# Patient Record
Sex: Female | Born: 1990 | Race: White | Hispanic: No | Marital: Married | Smoking: Never smoker
Health system: Southern US, Community
[De-identification: ages and names within clinical notes are randomized; demographics above are authoritative.]

## PROBLEM LIST (undated history)

## (undated) DIAGNOSIS — M549 Dorsalgia, unspecified: Secondary | ICD-10-CM

---

## 2021-03-16 ENCOUNTER — Other Ambulatory Visit: Payer: Self-pay

## 2021-03-16 ENCOUNTER — Ambulatory Visit
Admission: EM | Admit: 2021-03-16 | Discharge: 2021-03-16 | Disposition: A | Discharge status: Home / Self Care | Attending: Internal Medicine | Admitting: Internal Medicine

## 2021-03-16 ENCOUNTER — Ambulatory Visit (INDEPENDENT_AMBULATORY_CARE_PROVIDER_SITE_OTHER)

## 2021-03-16 ENCOUNTER — Encounter: Payer: Self-pay | Admitting: Emergency Medicine

## 2021-03-16 DIAGNOSIS — R0602 Shortness of breath: Secondary | ICD-10-CM | POA: Diagnosis not present

## 2021-03-16 DIAGNOSIS — R059 Cough, unspecified: Secondary | ICD-10-CM | POA: Diagnosis not present

## 2021-03-16 DIAGNOSIS — Z20822 Contact with and (suspected) exposure to covid-19: Secondary | ICD-10-CM | POA: Insufficient documentation

## 2021-03-16 DIAGNOSIS — R062 Wheezing: Secondary | ICD-10-CM

## 2021-03-16 HISTORY — DX: Dorsalgia, unspecified: M54.9

## 2021-03-16 MED ORDER — ALBUTEROL SULFATE HFA 108 (90 BASE) MCG/ACT IN AERS
2.0000 | INHALATION_SPRAY | Freq: Once | RESPIRATORY_TRACT | Status: AC
  Administered 2021-03-16: 2 via RESPIRATORY_TRACT

## 2021-03-16 MED ORDER — METHYLPREDNISOLONE 4 MG PO TBPK: ORAL_TABLET | ORAL | 0 refills | Status: DC

## 2021-03-16 NOTE — Discharge Instructions (Addendum)
Use the Albuterol inhaler every 4 hours while awake for 5-7 days If you get worse in the next 24-48h, go to ER.

## 2021-03-16 NOTE — ED Triage Notes (Signed)
Patient reports slight cough.  Patient reports feeling tired, fatigue and SOB that started this morning.  Patient denies difficulty breathing.

## 2021-03-16 NOTE — ED Provider Notes (Signed)
[facility]    CSN: [id number] Arrival date & time: 03/16/21  1337      History   Chief Complaint Chief Complaint  Patient presents with   Cough   Fatigue    HPI Jordan Franklin is a 30 y.o. female who presents with mild cough since today and taking deep breaths may provoke the cough.  Woke up this am fatigued and SOB which woke her up, and taking deep breaths does not feel she is getting good oxygenation, but denies having trouble breathing. Has noticed herself wheezing some. Does not have hx of allergies. Denies nothing new in her enviroment. Denies chest pains but has felt chest pressure. Denies prolonged lauing or trips in the past month.  She has done something similar in the past from allergies. She does not have any new pets, or used anything new around her last night. She is not on any hormones.     Past Medical History:  Diagnosis Date   Back pain     There are no problems to display for this patient.   Past Surgical History:  Procedure Laterality Date   CESAREAN SECTION      OB History   No obstetric history on file.      Home Medications    Prior to Admission medications   Medication Sig Start Date End Date Taking? Authorizing Provider  methylPREDNISolone (MEDROL DOSEPAK) 4 MG TBPK tablet Take as directed 03/16/21  Yes Rodriguez-Southworth, Nettie Elm, PA-C    Family History History reviewed. No pertinent family history.  Social History Social History   Tobacco Use   Smoking status: Never   Smokeless tobacco: Never  Vaping Use   Vaping Use: Never used  Substance Use Topics   Alcohol use: Yes   Drug use: Never     Allergies   Patient has no known allergies.   Review of Systems Review of Systems  Constitutional:  Positive for fatigue. Negative for chills, diaphoresis and fever.  HENT:  Positive for congestion, postnasal drip and rhinorrhea.   Eyes:  Negative for discharge.  Respiratory:  Positive for cough, chest tightness,  shortness of breath and wheezing.   Cardiovascular:  Negative for chest pain.  Gastrointestinal:        Denies GERD last night  Musculoskeletal:  Negative for gait problem.  Skin:  Negative for rash.  Allergic/Immunologic: Positive for environmental allergies.  Hematological:  Negative for adenopathy.    Physical Exam Triage Vital Signs ED Triage Vitals  Enc Vitals Group     BP 03/16/21 1400 104/80     Pulse Rate 03/16/21 1400 82     Resp 03/16/21 1400 15     Temp 03/16/21 1400 97.8 F (36.6 C)     Temp Source 03/16/21 1400 Oral     SpO2 03/16/21 1400 97 %     Weight 03/16/21 1355 235 lb (106.6 kg)     Height 03/16/21 1355 5\' 9"  (1.753 m)     Head Circumference --      Peak Flow --      Pain Score 03/16/21 1355 0     Pain Loc --      Pain Edu? --      Excl. in GC? --    No data found.  Updated Vital Signs BP 104/80 (BP Location: Left Arm)   Pulse 82   Temp 97.8 F (36.6 C) (Oral)   Resp 15   Ht 5\' 9"  (1.753 m)   Wt 235 lb (  106.6 kg)   LMP 02/16/2021 (Approximate)   SpO2 97%   BMI 34.70 kg/m   Visual Acuity Right Eye Distance:   Left Eye Distance:   Bilateral Distance:    Right Eye Near:   Left Eye Near:    Bilateral Near:     Physical Exam Physical Exam Constitutional:      General: He is not in acute distress.    Appearance: He is not toxic-appearing.  HENT:     Head: Normocephalic.     Right Ear: Tympanic membrane, ear canal and external ear normal.     Left Ear: Ear canal and external ear normal.     Nose: Nose normal.     Mouth/Throat:     Mouth: Mucous membranes are moist.     Pharynx: Oropharynx is clear.  Eyes:     General: No scleral icterus.    Conjunctiva/sclera: Conjunctivae normal.  Cardiovascular:     Rate and Rhythm: Normal rate and regular rhythm.     Heart sounds: No murmur heard.   Pulmonary:     Effort: Pulmonary effort is normal. No respiratory distress. She is able to speak full sentences.     Breath sounds: Exhalation  Wheezing present.     Comments: Has auditory wheezing with deep breaths Post Albuterol inhalation lung exam revealed resolved wheezing except faint one heard on LLL Musculoskeletal:        General: Normal range of motion.     Cervical back: Neck supple.  Lymphadenopathy:     Cervical: No cervical adenopathy.  Skin:    General: Skin is warm and dry.     Findings: No rash.  Neurological:     Mental Status: He is alert and oriented to person, place, and time.     Gait: Gait normal.  Psychiatric:        Mood and Affect: Mood normal.        Behavior: Behavior normal.        Thought Content: Thought content normal.        Judgment: Judgment normal.    UC Treatments / Results  Labs (all labs ordered are listed, but only abnormal results are displayed) Labs Reviewed  SARS CORONAVIRUS 2 (TAT 6-24 HRS)    EKG   Radiology DG Chest 2 View  Result Date: 03/16/2021 CLINICAL DATA:  Acute wheezing shortness of breath with slight cough EXAM: CHEST - 2 VIEW COMPARISON:  None. FINDINGS: The heart size and mediastinal contours are within normal limits. Both lungs are clear. The visualized skeletal structures are unremarkable. IMPRESSION: No active cardiopulmonary disease. Electronically Signed   By: Judie Petit.  Shick M.D.   On: 03/16/2021 14:30    Procedures Procedures (including critical care time)  Medications Ordered in UC Medications  albuterol (VENTOLIN HFA) 108 (90 Base) MCG/ACT inhaler 2 puff (2 puffs Inhalation Given 03/16/21 1415)    Initial Impression / Assessment and Plan / UC Course  I have reviewed the triage vital signs and the nursing notes. Pertinent imaging results that were available during my care of the patient were reviewed by me and considered in my medical decision making (see chart for details). Covid test is pending. Reactive airway reaction to unknown cause.  She was given Albuterol inhaler 2 puffs and checked 30 minutes later and she felt better including chest pressure  by 50% relief.  She was placed on Medrol dose pack and advised to use the Albuterol inhaler q 4h for 5-7 days. See instructions.  Final Clinical Impressions(s) / UC Diagnoses   Final diagnoses:  Wheezing     Discharge Instructions      Use the Albuterol inhaler every 4 hours while awake for 5-7 days If you get worse in the next 24-48h, go to ER.      ED Prescriptions     Medication Sig Dispense Auth. Provider   methylPREDNISolone (MEDROL DOSEPAK) 4 MG TBPK tablet Take as directed 21 tablet Rodriguez-Southworth, Nettie Elm, PA-C      PDMP not reviewed this encounter.   Garey Ham, PA-C 03/16/21 1449

## 2021-03-17 LAB — SARS CORONAVIRUS 2 (TAT 6-24 HRS): SARS Coronavirus 2: NEGATIVE

## 2021-04-08 ENCOUNTER — Encounter: Payer: Self-pay | Admitting: Emergency Medicine

## 2021-04-08 ENCOUNTER — Other Ambulatory Visit: Payer: Self-pay

## 2021-04-08 ENCOUNTER — Ambulatory Visit (INDEPENDENT_AMBULATORY_CARE_PROVIDER_SITE_OTHER)

## 2021-04-08 ENCOUNTER — Ambulatory Visit
Admission: EM | Admit: 2021-04-08 | Discharge: 2021-04-08 | Disposition: A | Discharge status: Home / Self Care | Attending: Internal Medicine | Admitting: Internal Medicine

## 2021-04-08 DIAGNOSIS — R0602 Shortness of breath: Secondary | ICD-10-CM

## 2021-04-08 DIAGNOSIS — R002 Palpitations: Secondary | ICD-10-CM | POA: Diagnosis not present

## 2021-04-08 DIAGNOSIS — Z20822 Contact with and (suspected) exposure to covid-19: Secondary | ICD-10-CM | POA: Insufficient documentation

## 2021-04-08 DIAGNOSIS — R000 Tachycardia, unspecified: Secondary | ICD-10-CM

## 2021-04-08 DIAGNOSIS — J209 Acute bronchitis, unspecified: Secondary | ICD-10-CM | POA: Diagnosis not present

## 2021-04-08 DIAGNOSIS — R051 Acute cough: Secondary | ICD-10-CM | POA: Diagnosis present

## 2021-04-08 MED ORDER — ALBUTEROL SULFATE HFA 108 (90 BASE) MCG/ACT IN AERS
2.0000 | INHALATION_SPRAY | Freq: Once | RESPIRATORY_TRACT | Status: AC
  Administered 2021-04-08: 2 via RESPIRATORY_TRACT

## 2021-04-08 MED ORDER — METHYLPREDNISOLONE SODIUM SUCC 125 MG IJ SOLR
60.0000 mg | Freq: Once | INTRAMUSCULAR | Status: AC
  Administered 2021-04-08: 60 mg via INTRAMUSCULAR

## 2021-04-08 MED ORDER — ALBUTEROL SULFATE HFA 108 (90 BASE) MCG/ACT IN AERS: 2.0000 | INHALATION_SPRAY | RESPIRATORY_TRACT | 0 refills | Status: DC | PRN

## 2021-04-08 MED ORDER — BENZONATATE 100 MG PO CAPS
100.0000 mg | ORAL_CAPSULE | Freq: Three times a day (TID) | ORAL | 0 refills | Status: DC | PRN
Start: 2021-04-08 — End: 2021-05-31

## 2021-04-08 MED ORDER — PREDNISONE 20 MG PO TABS: 20.0000 mg | ORAL_TABLET | Freq: Every day | ORAL | 0 refills | Status: AC

## 2021-04-08 NOTE — Discharge Instructions (Addendum)
Please use medications as prescribed Chest x-ray is negative for pneumonia If symptoms worsen please return to the urgent care or go to the emergency department for further evaluation. We will call you with recommendations if labs are abnormal.

## 2021-04-08 NOTE — ED Triage Notes (Signed)
Pt c/o shortness of breath, cough, racing heart beat. Started about 2 days ago. She states since she has been sitting int he waiting room she started having body aches.

## 2021-04-08 NOTE — ED Provider Notes (Signed)
[facility]    CSN: [id number] Arrival date & time: 04/08/21  1845      History   Chief Complaint Chief Complaint  Patient presents with   Shortness of Breath   Cough    HPI Jordan Franklin is a 30 y.o. female with past medical history of wheezing comes to urgent care with shortness of breath, cough and wheezing of 3 days duration.  Patient's symptoms started Friday and has been persistent.  Cough is nonproductive and associated with chest tightness.  She denies any exposure to any sick individuals.  No chest pain or chest pressure.  No fever or chills.  No nausea or vomiting.  No diarrhea.  Patient does not have a history of asthma.  He denies any history of smoking.  No inhalational exposures.  No dizziness, near syncope or syncopal episodes.  She has palpitations with shortness of breath.  HPI  Past Medical History:  Diagnosis Date   Back pain     There are no problems to display for this patient.   Past Surgical History:  Procedure Laterality Date   CESAREAN SECTION      OB History   No obstetric history on file.      Home Medications    Prior to Admission medications   Medication Sig Start Date End Date Taking? Authorizing Provider  albuterol (VENTOLIN HFA) 108 (90 Base) MCG/ACT inhaler Inhale 2 puffs into the lungs every 4 (four) hours as needed for wheezing or shortness of breath. 04/08/21  Yes Gerry Blanchfield, Britta Mccreedy, MD  benzonatate (TESSALON) 100 MG capsule Take 1 capsule (100 mg total) by mouth 3 (three) times daily as needed for cough. 04/08/21  Yes Mete Purdum, Britta Mccreedy, MD  predniSONE (DELTASONE) 20 MG tablet Take 1 tablet (20 mg total) by mouth daily for 5 days. 04/08/21 04/13/21 Yes Mousa Prout, Britta Mccreedy, MD    Family History No family history on file.  Social History Social History   Tobacco Use   Smoking status: Never   Smokeless tobacco: Never  Vaping Use   Vaping Use: Never used  Substance Use Topics   Alcohol use: Yes   Drug use: Never      Allergies   Patient has no known allergies.   Review of Systems Review of Systems  Constitutional:  Negative for chills and fever.  HENT:  Positive for congestion. Negative for sore throat.   Eyes: Negative.   Respiratory:  Positive for cough, chest tightness, shortness of breath and wheezing.   Cardiovascular:  Positive for chest pain and palpitations.  Genitourinary: Negative.   Neurological:  Positive for headaches.    Physical Exam Triage Vital Signs ED Triage Vitals  Enc Vitals Group     BP 04/08/21 1959 108/68     Pulse Rate 04/08/21 1959 85     Resp 04/08/21 1959 20     Temp 04/08/21 1959 98.3 F (36.8 C)     Temp Source 04/08/21 1959 Oral     SpO2 04/08/21 1959 97 %     Weight 04/08/21 1956 235 lb 0.2 oz (106.6 kg)     Height 04/08/21 1956 5\' 9"  (1.753 m)     Head Circumference --      Peak Flow --      Pain Score 04/08/21 1956 10     Pain Loc --      Pain Edu? --      Excl. in GC? --    No data found.  Updated Vital  Signs BP 108/68 (BP Location: Left Arm)   Pulse 85   Temp 98.3 F (36.8 C) (Oral)   Resp 20   Ht 5\' 9"  (1.753 m)   Wt 106.6 kg   LMP 04/05/2021 (Approximate)   SpO2 97%   BMI 34.71 kg/m   Visual Acuity Right Eye Distance:   Left Eye Distance:   Bilateral Distance:    Right Eye Near:   Left Eye Near:    Bilateral Near:     Physical Exam Vitals and nursing note reviewed.  Constitutional:      General: She is in acute distress.  Cardiovascular:     Rate and Rhythm: Normal rate and regular rhythm.  Pulmonary:     Breath sounds: Decreased breath sounds and wheezing present. No rhonchi or rales.  Abdominal:     General: Bowel sounds are normal.     Palpations: Abdomen is soft.  Neurological:     Mental Status: She is alert.     UC Treatments / Results  Labs (all labs ordered are listed, but only abnormal results are displayed) Labs Reviewed  SARS CORONAVIRUS 2 (TAT 6-24 HRS)  RESP PANEL BY RT-PCR (FLU A&B, COVID)  ARPGX2    EKG   Radiology No results found.  Procedures Procedures (including critical care time)  Medications Ordered in UC Medications  methylPREDNISolone sodium succinate (SOLU-MEDROL) 125 mg/2 mL injection 60 mg (has no administration in time range)  albuterol (VENTOLIN HFA) 108 (90 Base) MCG/ACT inhaler 2 puff (has no administration in time range)    Initial Impression / Assessment and Plan / UC Course  I have reviewed the triage vital signs and the nursing notes.  Pertinent labs & imaging results that were available during my care of the patient were reviewed by me and considered in my medical decision making (see chart for details).     1.  Acute bronchitis with bronchospasm: Solu-Medrol 60 mg IM x1 dose Albuterol inhaler x1 dose Tessalon Perles as needed for cough COVID-19 PCR test has been sent Maintain oral fluid intake If symptoms worsen please return to the urgent care Chest x-ray is negative for acute lung infiltrate. Final Clinical Impressions(s) / UC Diagnoses   Final diagnoses:  Bronchospasm with bronchitis, acute     Discharge Instructions      Please use medications as prescribed Chest x-ray is negative for pneumonia If symptoms worsen please return to the urgent care or go to the emergency department for further evaluation. We will call you with recommendations if labs are abnormal.     ED Prescriptions     Medication Sig Dispense Auth. Provider   albuterol (VENTOLIN HFA) 108 (90 Base) MCG/ACT inhaler Inhale 2 puffs into the lungs every 4 (four) hours as needed for wheezing or shortness of breath. 18 g Sabree Nuon, 04/07/2021, MD   predniSONE (DELTASONE) 20 MG tablet Take 1 tablet (20 mg total) by mouth daily for 5 days. 5 tablet Payden Docter, Britta Mccreedy, MD   benzonatate (TESSALON) 100 MG capsule Take 1 capsule (100 mg total) by mouth 3 (three) times daily as needed for cough. 30 capsule Dickey Caamano, Britta Mccreedy, MD      PDMP not reviewed this encounter.    Britta Mccreedy, MD 04/08/21 2030

## 2021-04-09 LAB — RESP PANEL BY RT-PCR (FLU A&B, COVID) ARPGX2
Influenza A by PCR: NEGATIVE
Influenza B by PCR: NEGATIVE
SARS Coronavirus 2 by RT PCR: NEGATIVE

## 2021-05-31 ENCOUNTER — Other Ambulatory Visit: Payer: Self-pay

## 2021-05-31 ENCOUNTER — Ambulatory Visit
Admission: EM | Admit: 2021-05-31 | Discharge: 2021-05-31 | Disposition: A | Discharge status: Home / Self Care | Attending: Internal Medicine | Admitting: Internal Medicine

## 2021-05-31 DIAGNOSIS — J111 Influenza due to unidentified influenza virus with other respiratory manifestations: Secondary | ICD-10-CM | POA: Diagnosis present

## 2021-05-31 LAB — RAPID INFLUENZA A&B ANTIGENS
Influenza A (ARMC): NEGATIVE
Influenza B (ARMC): NEGATIVE

## 2021-05-31 MED ORDER — OSELTAMIVIR PHOSPHATE 75 MG PO CAPS: 75.0000 mg | ORAL_CAPSULE | Freq: Two times a day (BID) | ORAL | 0 refills | Status: DC

## 2021-05-31 MED ORDER — BENZONATATE 200 MG PO CAPS: 200.0000 mg | ORAL_CAPSULE | Freq: Three times a day (TID) | ORAL | 0 refills | Status: DC | PRN

## 2021-05-31 MED ORDER — PSEUDOEPHEDRINE HCL ER 120 MG PO TB12: 120.0000 mg | ORAL_TABLET | Freq: Two times a day (BID) | ORAL | 0 refills | Status: DC

## 2021-05-31 NOTE — ED Provider Notes (Signed)
[facility]    CSN: [id number] Arrival date & time: 05/31/21  1553      History   Chief Complaint Chief Complaint  Patient presents with   Generalized Body Aches    HPI Stephany Vlcek is a 30 y.o. female who presents with onset of HA since yesterday. Today woke up with sinus pressure, worse HA, body aches and chills. Feels as when she had a flu in the past. Had covid 2 years ago and does not feel like then.    Past Medical History:  Diagnosis Date   Back pain     There are no problems to display for this patient.   Past Surgical History:  Procedure Laterality Date   CESAREAN SECTION      OB History   No obstetric history on file.      Home Medications    Prior to Admission medications   Medication Sig Start Date End Date Taking? Authorizing Provider  benzonatate (TESSALON) 200 MG capsule Take 1 capsule (200 mg total) by mouth 3 (three) times daily as needed for cough. 05/31/21  Yes Rodriguez-Southworth, Nettie Elm, PA-C  oseltamivir (TAMIFLU) 75 MG capsule Take 1 capsule (75 mg total) by mouth every 12 (twelve) hours. 05/31/21  Yes Rodriguez-Southworth, Nettie Elm, PA-C  pseudoephedrine (SUDAFED 12 HOUR) 120 MG 12 hr tablet Take 1 tablet (120 mg total) by mouth 2 (two) times daily. 05/31/21  Yes Rodriguez-Southworth, Viviana Simpler    Family History History reviewed. No pertinent family history.  Social History Social History   Tobacco Use   Smoking status: Never   Smokeless tobacco: Never  Vaping Use   Vaping Use: Never used  Substance Use Topics   Alcohol use: Yes   Drug use: Never     Allergies   Patient has no known allergies.   Review of Systems Review of Systems  Constitutional:  Positive for chills and fever. Negative for appetite change.  HENT:  Positive for congestion, postnasal drip and rhinorrhea. Negative for ear discharge.   Respiratory:  Positive for cough.   Musculoskeletal:  Positive for myalgias.  Skin:  Negative for rash.   Neurological:  Positive for headaches.    Physical Exam Triage Vital Signs ED Triage Vitals  Enc Vitals Group     BP 05/31/21 1711 103/68     Pulse Rate 05/31/21 1711 90     Resp 05/31/21 1711 18     Temp 05/31/21 1711 99.8 F (37.7 C)     Temp Source 05/31/21 1711 Oral     SpO2 05/31/21 1711 100 %     Weight 05/31/21 1710 240 lb (108.9 kg)     Height 05/31/21 1710 5\' 9"  (1.753 m)     Head Circumference --      Peak Flow --      Pain Score 05/31/21 1709 9     Pain Loc --      Pain Edu? --      Excl. in GC? --    No data found.  Updated Vital Signs BP 103/68 (BP Location: Left Arm)   Pulse 90   Temp 99.8 F (37.7 C) (Oral)   Resp 18   Ht 5\' 9"  (1.753 m)   Wt 240 lb (108.9 kg)   LMP 04/17/2021   SpO2 100%   BMI 35.44 kg/m   Visual Acuity Right Eye Distance:   Left Eye Distance:   Bilateral Distance:    Right Eye Near:   Left Eye Near:  Bilateral Near:       Physical Exam Vitals signs and nursing note reviewed.  Constitutional:      General: She is not in acute distress.    Appearance: Normal appearance. She is not ill-appearing, toxic-appearing or diaphoretic.  HENT:     Head: Normocephalic.     Right Ear: Tympanic membrane, ear canal and external ear normal.     Left Ear: Tympanic membrane, ear canal and external ear normal.     Nose: Nose normal.     Mouth/Throat:     Mouth: Mucous membranes are moist.  Eyes:     General: No scleral icterus.       Right eye: No discharge.        Left eye: No discharge.     Conjunctiva/sclera: Conjunctivae normal.  Neck:     Musculoskeletal: Neck supple. No neck rigidity.  Cardiovascular:     Rate and Rhythm: Normal rate and regular rhythm.     Heart sounds: No murmur.  Pulmonary:     Effort: Pulmonary effort is normal.     Breath sounds: Normal breath sounds.  Musculoskeletal: Normal range of motion.  Lymphadenopathy:     Cervical: No cervical adenopathy.  Skin:    General: Skin is warm and dry.      Coloration: Skin is not jaundiced.     Findings: No rash.  Neurological:     Mental Status: She is alert and oriented to person, place, and time.     Gait: Gait normal.  Psychiatric:        Mood and Affect: Mood normal.        Behavior: Behavior normal.        Thought Content: Thought content normal.        Judgment: Judgment normal.   UC Treatments / Results  Labs (all labs ordered are listed, but only abnormal results are displayed) Labs Reviewed  RAPID INFLUENZA A&B ANTIGENS  Flu A&B neg.   EKG   Radiology No results found.  Procedures Procedures (including critical care time)  Medications Ordered in UC Medications - No data to display  Initial Impression / Assessment and Plan / UC Course  I have reviewed the triage vital signs and the nursing notes. Pertinent labs  results that were available during my care of the patient were reviewed by me and considered in my medical decision making (see chart for details). Flu like illness Pt declined covid test, she has some at home and will do one when she gets home. If negative and she feels as when she had the flu in the past, I told her she may fill the rx I gave her for Tamiflu today. I also prescribed her Sudafed and Tessalon as noted. See instructions.    Final Clinical Impressions(s) / UC Diagnoses   Final diagnoses:  Influenza-like illness     Discharge Instructions      If the home covid test is negative, then start the Tamiflu, but if positive, dont.      ED Prescriptions     Medication Sig Dispense Auth. Provider   oseltamivir (TAMIFLU) 75 MG capsule Take 1 capsule (75 mg total) by mouth every 12 (twelve) hours. 10 capsule Rodriguez-Southworth, Nettie Elm, PA-C   pseudoephedrine (SUDAFED 12 HOUR) 120 MG 12 hr tablet Take 1 tablet (120 mg total) by mouth 2 (two) times daily. 14 tablet Rodriguez-Southworth, Layten Aiken, PA-C   benzonatate (TESSALON) 200 MG capsule Take 1 capsule (200 mg total) by mouth 3 (three)  times daily as needed for cough. 30 capsule Rodriguez-Southworth, Nettie Elm, PA-C      PDMP not reviewed this encounter.   Garey Ham, Cordelia Poche 05/31/21 1920

## 2021-05-31 NOTE — Discharge Instructions (Signed)
If the home covid test is negative, then start the Tamiflu, but if positive, dont.

## 2021-05-31 NOTE — ED Triage Notes (Signed)
Pt here with C/O headache yesterday, facial pressure since waking up this morning, body aches.

## 2021-06-21 ENCOUNTER — Ambulatory Visit
Admission: EM | Admit: 2021-06-21 | Discharge: 2021-06-21 | Disposition: A | Discharge status: Home / Self Care | Attending: Physician Assistant | Admitting: Physician Assistant

## 2021-06-21 ENCOUNTER — Encounter: Payer: Self-pay | Admitting: Emergency Medicine

## 2021-06-21 ENCOUNTER — Other Ambulatory Visit: Payer: Self-pay

## 2021-06-21 DIAGNOSIS — J029 Acute pharyngitis, unspecified: Secondary | ICD-10-CM | POA: Diagnosis present

## 2021-06-21 LAB — GROUP A STREP BY PCR: Group A Strep by PCR: NOT DETECTED

## 2021-06-21 NOTE — ED Triage Notes (Signed)
Patient c/o sore throat that started yesterday.  Patient denies recent fevers.

## 2021-06-21 NOTE — ED Provider Notes (Signed)
[facility]    CSN: [id number] Arrival date & time: 06/21/21  1834      History   Chief Complaint Chief Complaint  Patient presents with   Sore Throat    HPI Jordan Franklin is a 30 y.o. female.   HPI  Sore Throat: Pt reports that she has had a sore throat since yesterday. Rated 6/10 in nature. She denies any fevers, vomiting, abdominal pain, cough. She has had a mild cough and mild congestion. No known sick contacts. She has tried tylenol for symptoms with some relief.   Past Medical History:  Diagnosis Date   Back pain     There are no problems to display for this patient.   Past Surgical History:  Procedure Laterality Date   CESAREAN SECTION      OB History   No obstetric history on file.      Home Medications    Prior to Admission medications   Medication Sig Start Date End Date Taking? Authorizing Provider  benzonatate (TESSALON) 200 MG capsule Take 1 capsule (200 mg total) by mouth 3 (three) times daily as needed for cough. 05/31/21   Rodriguez-Southworth, Nettie Elm, PA-C  oseltamivir (TAMIFLU) 75 MG capsule Take 1 capsule (75 mg total) by mouth every 12 (twelve) hours. 05/31/21   Rodriguez-Southworth, Nettie Elm, PA-C  pseudoephedrine (SUDAFED 12 HOUR) 120 MG 12 hr tablet Take 1 tablet (120 mg total) by mouth 2 (two) times daily. 05/31/21   Rodriguez-Southworth, Nettie Elm, PA-C    Family History History reviewed. No pertinent family history.  Social History Social History   Tobacco Use   Smoking status: Never   Smokeless tobacco: Never  Vaping Use   Vaping Use: Never used  Substance Use Topics   Alcohol use: Yes   Drug use: Never     Allergies   Patient has no known allergies.   Review of Systems Review of Systems  As stated above in HPI Physical Exam Triage Vital Signs ED Triage Vitals  Enc Vitals Group     BP 06/21/21 1923 128/90     Pulse Rate 06/21/21 1923 73     Resp 06/21/21 1923 14     Temp 06/21/21 1923 98.2 F (36.8 C)      Temp Source 06/21/21 1923 Oral     SpO2 06/21/21 1923 100 %     Weight 06/21/21 1921 230 lb (104.3 kg)     Height 06/21/21 1921 5\' 9"  (1.753 m)     Head Circumference --      Peak Flow --      Pain Score 06/21/21 1920 6     Pain Loc --      Pain Edu? --      Excl. in GC? --    No data found.  Updated Vital Signs BP 128/90 (BP Location: Left Arm)   Pulse 73   Temp 98.2 F (36.8 C) (Oral)   Resp 14   Ht 5\' 9"  (1.753 m)   Wt 230 lb (104.3 kg)   LMP 05/29/2021 (Approximate)   SpO2 100%   BMI 33.97 kg/m   Physical Exam Vitals and nursing note reviewed.  Constitutional:      General: She is not in acute distress.    Appearance: She is well-developed. She is not ill-appearing, toxic-appearing or diaphoretic.  HENT:     Head: Normocephalic and atraumatic.     Right Ear: Tympanic membrane normal.     Left Ear: Tympanic membrane normal.  Nose: Congestion present. No rhinorrhea (mild).     Mouth/Throat:     Mouth: Mucous membranes are moist. No oral lesions.     Pharynx: Oropharynx is clear. Uvula midline. Posterior oropharyngeal erythema (scant) present. No pharyngeal swelling, oropharyngeal exudate or uvula swelling.     Tonsils: No tonsillar exudate or tonsillar abscesses. 1+ on the right. 1+ on the left.  Eyes:     Conjunctiva/sclera: Conjunctivae normal.     Pupils: Pupils are equal, round, and reactive to light.  Cardiovascular:     Rate and Rhythm: Normal rate and regular rhythm.     Heart sounds: Normal heart sounds.  Pulmonary:     Effort: Pulmonary effort is normal.     Breath sounds: Normal breath sounds.  Musculoskeletal:     Cervical back: Normal range of motion and neck supple.  Lymphadenopathy:     Cervical: Cervical adenopathy present.  Skin:    General: Skin is warm.  Neurological:     Mental Status: She is alert and oriented to person, place, and time.     UC Treatments / Results  Labs (all labs ordered are listed, but only abnormal results  are displayed) Labs Reviewed  GROUP A STREP BY PCR    EKG   Radiology No results found.  Procedures Procedures (including critical care time)  Medications Ordered in UC Medications - No data to display  Initial Impression / Assessment and Plan / UC Course  I have reviewed the triage vital signs and the nursing notes.  Pertinent labs & imaging results that were available during my care of the patient were reviewed by me and considered in my medical decision making (see chart for details).     New. Appears viral in nature. Discussed OTC medications that may be helpful along with rest and hydration with water. Discussed concerning and red flag signs and symptoms warranting further work up.    Final Clinical Impressions(s) / UC Diagnoses   Final diagnoses:  None   Discharge Instructions   None    ED Prescriptions   None    PDMP not reviewed this encounter.   Rushie Chestnut, Cordelia Poche 06/21/21 2003

## 2021-08-05 ENCOUNTER — Ambulatory Visit
Admission: EM | Admit: 2021-08-05 | Discharge: 2021-08-05 | Disposition: A | Discharge status: Home / Self Care | Attending: Emergency Medicine | Admitting: Emergency Medicine

## 2021-08-05 ENCOUNTER — Other Ambulatory Visit: Payer: Self-pay

## 2021-08-05 DIAGNOSIS — Z20822 Contact with and (suspected) exposure to covid-19: Secondary | ICD-10-CM | POA: Insufficient documentation

## 2021-08-05 DIAGNOSIS — R519 Headache, unspecified: Secondary | ICD-10-CM | POA: Insufficient documentation

## 2021-08-05 DIAGNOSIS — R051 Acute cough: Secondary | ICD-10-CM | POA: Insufficient documentation

## 2021-08-05 DIAGNOSIS — J069 Acute upper respiratory infection, unspecified: Secondary | ICD-10-CM

## 2021-08-05 MED ORDER — PROMETHAZINE-DM 6.25-15 MG/5ML PO SYRP: 5.0000 mL | ORAL_SOLUTION | Freq: Four times a day (QID) | ORAL | 0 refills | Status: DC | PRN

## 2021-08-05 MED ORDER — ALBUTEROL SULFATE HFA 108 (90 BASE) MCG/ACT IN AERS: 2.0000 | INHALATION_SPRAY | RESPIRATORY_TRACT | 0 refills | Status: DC | PRN

## 2021-08-05 MED ORDER — IPRATROPIUM BROMIDE 0.06 % NA SOLN: 2.0000 | Freq: Four times a day (QID) | NASAL | 12 refills | Status: DC

## 2021-08-05 MED ORDER — BENZONATATE 100 MG PO CAPS: 200.0000 mg | ORAL_CAPSULE | Freq: Three times a day (TID) | ORAL | 0 refills | Status: DC

## 2021-08-05 MED ORDER — AEROCHAMBER MV MISC: 2 refills | Status: DC

## 2021-08-05 NOTE — Discharge Instructions (Signed)
Isolate at home pending the results of your COVID test.  If you test positive then you will have to quarantine for 5 days from the start of your symptoms.  After 5 days you can break quarantine if your symptoms have improved and you have not had a fever for 24 hours without taking Tylenol or ibuprofen.  Use over-the-counter Tylenol and ibuprofen as needed for body aches and fever.  Use the Atrovent nasal spray, 2 squirts in each nostril every 6 hours, as needed for runny nose and postnasal drip.  Use the Tessalon Perles every 8 hours during the day.  Take them with a small sip of water.  They may give you some numbness to the base of your tongue or a metallic taste in your mouth, this is normal.  Use the Promethazine DM cough syrup at bedtime for cough and congestion.  It will make you drowsy so do not take it during the day.  If you test positive for COVID we will call in Paxlovid for you to take since you are in the 5 day window from symptom onset.   If you develop any increased shortness of breath-especially at rest, you are unable to speak in full sentences, or is a late sign your lips are turning blue you need to go the ER for evaluation.

## 2021-08-05 NOTE — ED Provider Notes (Signed)
[facility]    CSN: [id number] Arrival date & time: 08/05/21  0932      History   Chief Complaint Chief Complaint  Patient presents with   Cough    HPI Jordan Franklin is a 31 y.o. female.   HPI  31 year old female here for evaluation of respiratory complaints.  Patient is here for evaluation of 3 days worth of headache, runny nose nasal congestion, sore throat that she attributes to coughing, a cough that is productive for a white sputum, wheezing, headache, and body aches.  She denies any fever, ear pain, shortness of breath, or GI complaints.  She also denies any known sick contacts.  Past Medical History:  Diagnosis Date   Back pain     There are no problems to display for this patient.   Past Surgical History:  Procedure Laterality Date   CESAREAN SECTION      OB History   No obstetric history on file.      Home Medications    Prior to Admission medications   Medication Sig Start Date End Date Taking? Authorizing Provider  albuterol (VENTOLIN HFA) 108 (90 Base) MCG/ACT inhaler Inhale 2 puffs into the lungs every 4 (four) hours as needed. 08/05/21  Yes Becky Augusta, NP  benzonatate (TESSALON) 100 MG capsule Take 2 capsules (200 mg total) by mouth every 8 (eight) hours. 08/05/21  Yes Becky Augusta, NP  ipratropium (ATROVENT) 0.06 % nasal spray Place 2 sprays into both nostrils 4 (four) times daily. 08/05/21  Yes Becky Augusta, NP  promethazine-dextromethorphan (PROMETHAZINE-DM) 6.25-15 MG/5ML syrup Take 5 mLs by mouth 4 (four) times daily as needed. 08/05/21  Yes Becky Augusta, NP  Spacer/Aero-Holding Chambers (AEROCHAMBER MV) inhaler Use as instructed 08/05/21  Yes Becky Augusta, NP    Family History History reviewed. No pertinent family history.  Social History Social History   Tobacco Use   Smoking status: Never   Smokeless tobacco: Never  Vaping Use   Vaping Use: Never used  Substance Use Topics   Alcohol use: Yes   Drug use: Never      Allergies   Patient has no known allergies.   Review of Systems Review of Systems  Constitutional:  Negative for activity change, appetite change and fever.  HENT:  Positive for congestion, rhinorrhea and sore throat. Negative for ear pain.   Respiratory:  Positive for cough and wheezing. Negative for shortness of breath.   Gastrointestinal:  Negative for diarrhea, nausea and vomiting.  Musculoskeletal:  Positive for arthralgias and myalgias.  Skin:  Negative for rash.  Neurological:  Positive for headaches.  Hematological: Negative.   Psychiatric/Behavioral: Negative.      Physical Exam Triage Vital Signs ED Triage Vitals  Enc Vitals Group     BP 08/05/21 0841 113/74     Pulse Rate 08/05/21 0841 (!) 102     Resp 08/05/21 0841 16     Temp 08/05/21 0841 98.7 F (37.1 C)     Temp Source 08/05/21 0841 Oral     SpO2 08/05/21 0841 94 %     Weight 08/05/21 0834 230 lb (104.3 kg)     Height 08/05/21 0834 5\' 9"  (1.753 m)     Head Circumference --      Peak Flow --      Pain Score 08/05/21 0840 8     Pain Loc --      Pain Edu? --      Excl. in GC? --    No  data found.  Updated Vital Signs BP 113/74 (BP Location: Left Arm)    Pulse (!) 102    Temp 98.7 F (37.1 C) (Oral)    Resp 16    Ht 5\' 9"  (1.753 m)    Wt 230 lb (104.3 kg)    LMP 07/01/2021    SpO2 94%    BMI 33.96 kg/m   Visual Acuity Right Eye Distance:   Left Eye Distance:   Bilateral Distance:    Right Eye Near:   Left Eye Near:    Bilateral Near:     Physical Exam Vitals and nursing note reviewed.  Constitutional:      General: She is not in acute distress.    Appearance: Normal appearance. She is not ill-appearing.  HENT:     Head: Normocephalic and atraumatic.     Right Ear: Tympanic membrane, ear canal and external ear normal. There is no impacted cerumen.     Left Ear: Tympanic membrane, ear canal and external ear normal. There is no impacted cerumen.     Nose: Congestion and rhinorrhea  present.     Mouth/Throat:     Mouth: Mucous membranes are moist.     Pharynx: Oropharynx is clear. Posterior oropharyngeal erythema present.  Cardiovascular:     Rate and Rhythm: Normal rate and regular rhythm.     Pulses: Normal pulses.     Heart sounds: Normal heart sounds. No murmur heard.   No friction rub. No gallop.  Pulmonary:     Effort: Pulmonary effort is normal.     Breath sounds: Normal breath sounds. No wheezing, rhonchi or rales.  Musculoskeletal:     Cervical back: Normal range of motion and neck supple.  Lymphadenopathy:     Cervical: No cervical adenopathy.  Skin:    General: Skin is warm and dry.     Capillary Refill: Capillary refill takes less than 2 seconds.     Findings: No erythema or rash.  Neurological:     General: No focal deficit present.     Mental Status: She is alert and oriented to person, place, and time.  Psychiatric:        Mood and Affect: Mood normal.        Behavior: Behavior normal.        Thought Content: Thought content normal.        Judgment: Judgment normal.     UC Treatments / Results  Labs (all labs ordered are listed, but only abnormal results are displayed) Labs Reviewed  SARS CORONAVIRUS 2 (TAT 6-24 HRS)    EKG   Radiology No results found.  Procedures Procedures (including critical care time)  Medications Ordered in UC Medications - No data to display  Initial Impression / Assessment and Plan / UC Course  I have reviewed the triage vital signs and the nursing notes.  Pertinent labs & imaging results that were available during my care of the patient were reviewed by me and considered in my medical decision making (see chart for details).  Patient is a nontoxic-appearing 31 year old female here for evaluation of upper respiratory complaints as outlined in HPI above.  Her symptoms have been going on for the past 3 days and the patient denies any fever or sick contacts.  On physical exam patient has pearly-gray  tympanic membranes bilaterally with normal light reflex and clear external auditory canals.  Nasal mucosa is erythematous and edematous with clear discharge in both nares.  Oropharyngeal exam does reveal posterior  oropharyngeal erythema and injection with clear postnasal drip.  No cervical lymphadenopathy appreciated on exam.  Cardiopulmonary exam reveals clear lung sounds in all fields.  Patient's exam is consistent with a viral URI.  She is outside the therapeutic window for influenza so I will not swab her but she could also possibly have COVID.  We will swab the patient for COVID and discharged home to isolate pending the results.  If she test positive she is a candidate for Paxlovid.  She has a CMP in epic from 12/06/2020 performed at [facility] that shows a GFR of 91.  I will discharge her home with Atrovent nasal spray, Tessalon Perles, and Promethazine DM cough syrup.  Work note provided.   Final Clinical Impressions(s) / UC Diagnoses   Final diagnoses:  Viral URI with cough     Discharge Instructions      Isolate at home pending the results of your COVID test.  If you test positive then you will have to quarantine for 5 days from the start of your symptoms.  After 5 days you can break quarantine if your symptoms have improved and you have not had a fever for 24 hours without taking Tylenol or ibuprofen.  Use over-the-counter Tylenol and ibuprofen as needed for body aches and fever.  Use the Atrovent nasal spray, 2 squirts in each nostril every 6 hours, as needed for runny nose and postnasal drip.  Use the Tessalon Perles every 8 hours during the day.  Take them with a small sip of water.  They may give you some numbness to the base of your tongue or a metallic taste in your mouth, this is normal.  Use the Promethazine DM cough syrup at bedtime for cough and congestion.  It will make you drowsy so do not take it during the day.  If you test positive for COVID we will call in Paxlovid  for you to take since you are in the 5 day window from symptom onset.   If you develop any increased shortness of breath-especially at rest, you are unable to speak in full sentences, or is a late sign your lips are turning blue you need to go the ER for evaluation.      ED Prescriptions     Medication Sig Dispense Auth. Provider   albuterol (VENTOLIN HFA) 108 (90 Base) MCG/ACT inhaler Inhale 2 puffs into the lungs every 4 (four) hours as needed. 18 g Becky Augusta, NP   Spacer/Aero-Holding Chambers (AEROCHAMBER MV) inhaler Use as instructed 1 each Becky Augusta, NP   benzonatate (TESSALON) 100 MG capsule Take 2 capsules (200 mg total) by mouth every 8 (eight) hours. 21 capsule Becky Augusta, NP   ipratropium (ATROVENT) 0.06 % nasal spray Place 2 sprays into both nostrils 4 (four) times daily. 15 mL Becky Augusta, NP   promethazine-dextromethorphan (PROMETHAZINE-DM) 6.25-15 MG/5ML syrup Take 5 mLs by mouth 4 (four) times daily as needed. 118 mL Becky Augusta, NP      PDMP not reviewed this encounter.   Becky Augusta, NP 08/05/21 [phone]

## 2021-08-05 NOTE — ED Triage Notes (Signed)
Pt here with C/O headache, cough, chest congestion, hurts back to cough since Friday. Denies fever.

## 2021-08-06 LAB — SARS CORONAVIRUS 2 (TAT 6-24 HRS): SARS Coronavirus 2: NEGATIVE

## 2021-10-22 ENCOUNTER — Ambulatory Visit
Admission: EM | Admit: 2021-10-22 | Discharge: 2021-10-22 | Disposition: A | Discharge status: Home / Self Care | Attending: Emergency Medicine | Admitting: Emergency Medicine

## 2021-10-22 ENCOUNTER — Other Ambulatory Visit: Payer: Self-pay

## 2021-10-22 ENCOUNTER — Ambulatory Visit (INDEPENDENT_AMBULATORY_CARE_PROVIDER_SITE_OTHER)

## 2021-10-22 DIAGNOSIS — M545 Low back pain, unspecified: Secondary | ICD-10-CM | POA: Diagnosis not present

## 2021-10-22 DIAGNOSIS — M5432 Sciatica, left side: Secondary | ICD-10-CM | POA: Diagnosis not present

## 2021-10-22 DIAGNOSIS — M5137 Other intervertebral disc degeneration, lumbosacral region: Secondary | ICD-10-CM

## 2021-10-22 MED ORDER — METHYLPREDNISOLONE SODIUM SUCC 125 MG IJ SOLR
60.0000 mg | Freq: Once | INTRAMUSCULAR | Status: DC
Start: 2021-10-22 — End: 2021-10-22

## 2021-10-22 MED ORDER — HYDROCODONE-ACETAMINOPHEN 5-325 MG PO TABS: 1.0000 | ORAL_TABLET | Freq: Four times a day (QID) | ORAL | 0 refills | Status: AC | PRN

## 2021-10-22 MED ORDER — CYCLOBENZAPRINE HCL 10 MG PO TABS: 10.0000 mg | ORAL_TABLET | Freq: Two times a day (BID) | ORAL | 0 refills | Status: AC | PRN

## 2021-10-22 MED ORDER — PREDNISONE 10 MG (21) PO TBPK: ORAL_TABLET | Freq: Every day | ORAL | 0 refills | Status: DC

## 2021-10-22 MED ORDER — METHYLPREDNISOLONE SODIUM SUCC 125 MG IJ SOLR
60.0000 mg | Freq: Once | INTRAMUSCULAR | Status: AC
  Administered 2021-10-22: 60 mg via INTRAMUSCULAR

## 2021-10-22 MED ORDER — KETOROLAC TROMETHAMINE 60 MG/2ML IM SOLN
60.0000 mg | Freq: Once | INTRAMUSCULAR | Status: AC
  Administered 2021-10-22: 60 mg via INTRAMUSCULAR

## 2021-10-22 MED ORDER — METHYLPREDNISOLONE SODIUM SUCC 125 MG IJ SOLR: 60.0000 mg | Freq: Once | INTRAMUSCULAR | Status: DC

## 2021-10-22 NOTE — ED Provider Notes (Signed)
?[facility] ? ? ? ?CSN: [id number] ?Arrival date & time: 10/22/21  1039 ? ? ?  ? ?History   ?Chief Complaint ?Chief Complaint  ?Patient presents with  ? Back Pain  ? ? ?HPI ?Jordan Franklin is a 31 y.o. female.  ? ?31 year old female, Jordan Franklin, presents to urgent care with chief complaint of low back pain.  Patient denies any trauma fall or injury. Patient states she is having pain that goes down her left leg no saddle numbness no loss of bowel and bladder, patient is tearful.  Patient states she stretch prior to going to work today ? ?The history is provided by the patient. No language interpreter was used.  ? ?Past Medical History:  ?Diagnosis Date  ? Back pain   ? ? ?Patient Active Problem List  ? Diagnosis Date Noted  ? DDD (degenerative disc disease), lumbosacral 10/22/2021  ? Sciatica of left side 10/22/2021  ? ? ?Past Surgical History:  ?Procedure Laterality Date  ? CESAREAN SECTION    ? ? ?OB History   ?No obstetric history on file. ?  ? ? ? ?Home Medications   ? ?Prior to Admission medications   ?Medication Sig Start Date End Date Taking? Authorizing Provider  ?cyclobenzaprine (FLEXERIL) 10 MG tablet Take 1 tablet (10 mg total) by mouth 2 (two) times daily as needed for up to 5 days for muscle spasms. 10/22/21 10/27/21 Yes Makiya Jeune, Para March, NP  ?HYDROcodone-acetaminophen (NORCO/VICODIN) 5-325 MG tablet Take 1 tablet by mouth every 6 (six) hours as needed for up to 3 days for severe pain or moderate pain. 10/22/21 10/25/21 Yes Rheda Kassab, Para March, NP  ?predniSONE (STERAPRED UNI-PAK 21 TAB) 10 MG (21) TBPK tablet Take by mouth daily. Take 6 tabs by mouth daily  for 2 days, then 5 tabs for 2 days, then 4 tabs for 2 days, then 3 tabs for 2 days, 2 tabs for 2 days, then 1 tab by mouth daily for 2 days 10/23/21  Yes Sergio Zawislak, Para March, NP  ?albuterol (VENTOLIN HFA) 108 (90 Base) MCG/ACT inhaler Inhale 2 puffs into the lungs every 4 (four) hours as needed. 08/05/21   Becky Augusta, NP  ?Spacer/Aero-Holding  Chambers (AEROCHAMBER MV) inhaler Use as instructed 08/05/21   Becky Augusta, NP  ? ? ?Family History ?History reviewed. No pertinent family history. ? ?Social History ?Social History  ? ?Tobacco Use  ? Smoking status: Never  ? Smokeless tobacco: Never  ?Vaping Use  ? Vaping Use: Never used  ?Substance Use Topics  ? Alcohol use: Yes  ? Drug use: Never  ? ? ? ?Allergies   ?Patient has no known allergies. ? ? ?Review of Systems ?Review of Systems  ?Genitourinary:  Negative for dysuria.  ?Musculoskeletal:  Positive for back pain and myalgias.  ?Skin:  Negative for rash.  ?All other systems reviewed and are negative. ? ? ?Physical Exam ?Triage Vital Signs ?ED Triage Vitals  ?Enc Vitals Group  ?   BP   ?   Pulse   ?   Resp   ?   Temp   ?   Temp src   ?   SpO2   ?   Weight   ?   Height   ?   Head Circumference   ?   Peak Flow   ?   Pain Score   ?   Pain Loc   ?   Pain Edu?   ?   Excl. in GC?   ? ?No data found. ? ?  Updated Vital Signs ?BP 112/76 (BP Location: Left Arm)   Pulse 82   Temp 97.7 ?F (36.5 ?C) (Oral)   Resp 18   Ht 5\' 9"  (1.753 m)   Wt 230 lb (104.3 kg)   LMP 10/01/2021   SpO2 98%   BMI 33.97 kg/m?  ? ?Visual Acuity ?Right Eye Distance:   ?Left Eye Distance:   ?Bilateral Distance:   ? ?Right Eye Near:   ?Left Eye Near:    ?Bilateral Near:    ? ?Physical Exam ?Vitals and nursing note reviewed.  ?Constitutional:   ?   General: She is not in acute distress. ?   Appearance: She is well-developed and well-groomed.  ?HENT:  ?   Head: Normocephalic and atraumatic.  ?Eyes:  ?   Conjunctiva/sclera: Conjunctivae normal.  ?Cardiovascular:  ?   Rate and Rhythm: Normal rate and regular rhythm.  ?   Heart sounds: No murmur heard. ?Pulmonary:  ?   Effort: Pulmonary effort is normal. No respiratory distress.  ?   Breath sounds: Normal breath sounds.  ?Abdominal:  ?   Palpations: Abdomen is soft.  ?   Tenderness: There is no abdominal tenderness.  ?Musculoskeletal:     ?   General: No swelling.  ?   Cervical back: Neck  supple.  ?   Lumbar back: Spasms and tenderness present. Positive left straight leg raise test. Negative right straight leg raise test.  ?     Back: ? ?Skin: ?   General: Skin is warm and dry.  ?   Capillary Refill: Capillary refill takes less than 2 seconds.  ?Neurological:  ?   General: No focal deficit present.  ?   Mental Status: She is alert and oriented to person, place, and time.  ?   GCS: GCS eye subscore is 4. GCS verbal subscore is 5. GCS motor subscore is 6.  ?Psychiatric:     ?   Attention and Perception: Attention normal.     ?   Mood and Affect: Mood normal.     ?   Speech: Speech normal.     ?   Behavior: Behavior normal. Behavior is cooperative.  ? ? ? ?UC Treatments / Results  ?Labs ?(all labs ordered are listed, but only abnormal results are displayed) ?Labs Reviewed - No data to display ? ?EKG ? ? ?Radiology ?DG Lumbar Spine Complete ? ?Result Date: 10/22/2021 ?CLINICAL DATA:  Back pain EXAM: LUMBAR SPINE - COMPLETE 4+ VIEW COMPARISON:  None. FINDINGS: Five lumbar type vertebral bodies. No evidence of pars break. Anteroposterior alignment is maintained. Vertebral body heights are preserved. Disc space narrowing is present at L4-L5 and L5-S1 with posterior endplate osteophytes. No significant facet hypertrophy. IMPRESSION: Lower lumbar degenerative disc disease. Electronically Signed   By: 12/22/2021 M.D.   On: 10/22/2021 12:40   ? ?Procedures ?Procedures (including critical care time) ? ?Medications Ordered in UC ?Medications  ?ketorolac (TORADOL) injection 60 mg (60 mg Intramuscular Given 10/22/21 1257)  ?methylPREDNISolone sodium succinate (SOLU-MEDROL) 125 mg/2 mL injection 60 mg (60 mg Intramuscular Given 10/22/21 1304)  ? ? ?Initial Impression / Assessment and Plan / UC Course  ?I have reviewed the triage vital signs and the nursing notes. ? ?Pertinent labs & imaging results that were available during my care of the patient were reviewed by me and considered in my medical decision making  (see chart for details). ? ?Clinical Course as of 10/22/21 2038  ?Tue Oct 22, 2021  ?1229 Pt to  xray, ordered toradol, solu medrol for back pain, no muscle relaxers or lidocaine patches available for use in urgent care with back pain [JD]  ?  ?Clinical Course User Index ?[JD] Cinthya Bors, Para MarchJeanette, NP  ? ? ?Ddx: DDD-lumbosacral spine, sciatica, muscle spasm ?Final Clinical Impressions(s) / UC Diagnoses  ? ?Final diagnoses:  ?DDD (degenerative disc disease), lumbosacral  ?Sciatica of left side  ? ? ? ?Discharge Instructions   ? ?  ?Your xray was negative for acute findings, shows degenerative disc disease. Please take muscle relaxers, prednisone, pain med as directed. Ice 20 min 3 x daily. Avoid lifting,turning,twisting as it will aggravate your back. Follow up/go to ER if you develop saddle numbness, loss of bowel and bladder, worsening pain.  ? ? ? ? ?ED Prescriptions   ? ? Medication Sig Dispense Auth. Provider  ? cyclobenzaprine (FLEXERIL) 10 MG tablet Take 1 tablet (10 mg total) by mouth 2 (two) times daily as needed for up to 5 days for muscle spasms. 10 tablet Zaleah Ternes, NP  ? predniSONE (STERAPRED UNI-PAK 21 TAB) 10 MG (21) TBPK tablet Take by mouth daily. Take 6 tabs by mouth daily  for 2 days, then 5 tabs for 2 days, then 4 tabs for 2 days, then 3 tabs for 2 days, 2 tabs for 2 days, then 1 tab by mouth daily for 2 days 21 tablet Santhosh Gulino, Para MarchJeanette, NP  ? HYDROcodone-acetaminophen (NORCO/VICODIN) 5-325 MG tablet Take 1 tablet by mouth every 6 (six) hours as needed for up to 3 days for severe pain or moderate pain. 10 tablet Andreah Goheen, Para MarchJeanette, NP  ? ?  ? ?I have reviewed the PDMP during this encounter. ?  ?Clancy Gourdefelice, Flavius Repsher, NP ?10/22/21 2038 ? ?

## 2021-10-22 NOTE — Discharge Instructions (Addendum)
Your xray was negative for acute findings, shows degenerative disc disease. Please take muscle relaxers, prednisone, pain med as directed. Ice 20 min 3 x daily. Avoid lifting,turning,twisting as it will aggravate your back. Follow up/go to ER if you develop saddle numbness, loss of bowel and bladder, worsening pain.  ?

## 2021-10-22 NOTE — ED Triage Notes (Signed)
Pt c/o back pain.  ? ?Pt states that when she arrived at work she had a sharp pain at work and that it is worsening. Pt is having pain shoot down her left leg and now it is numb. Pt states that the pain goes down her left leg when moving. ? ?

## 2022-01-15 ENCOUNTER — Encounter: Payer: Self-pay | Admitting: Emergency Medicine

## 2022-01-15 ENCOUNTER — Ambulatory Visit
Admission: EM | Admit: 2022-01-15 | Discharge: 2022-01-15 | Disposition: A | Discharge status: Home / Self Care | Attending: Internal Medicine | Admitting: Internal Medicine

## 2022-01-15 ENCOUNTER — Other Ambulatory Visit: Payer: Self-pay

## 2022-01-15 DIAGNOSIS — B9789 Other viral agents as the cause of diseases classified elsewhere: Secondary | ICD-10-CM | POA: Diagnosis present

## 2022-01-15 DIAGNOSIS — J019 Acute sinusitis, unspecified: Secondary | ICD-10-CM | POA: Diagnosis present

## 2022-01-15 DIAGNOSIS — U071 COVID-19: Secondary | ICD-10-CM | POA: Insufficient documentation

## 2022-01-15 LAB — RESP PANEL BY RT-PCR (FLU A&B, COVID) ARPGX2
Influenza A by PCR: NEGATIVE
Influenza B by PCR: NEGATIVE
SARS Coronavirus 2 by RT PCR: POSITIVE — AB

## 2022-01-15 MED ORDER — FLUTICASONE PROPIONATE 50 MCG/ACT NA SUSP: 1.0000 | Freq: Every day | NASAL | 0 refills | Status: AC

## 2022-01-15 MED ORDER — BENZONATATE 100 MG PO CAPS: 100.0000 mg | ORAL_CAPSULE | Freq: Three times a day (TID) | ORAL | 0 refills | Status: AC | PRN

## 2022-01-15 MED ORDER — GUAIFENESIN ER 600 MG PO TB12: 600.0000 mg | ORAL_TABLET | Freq: Two times a day (BID) | ORAL | 0 refills | Status: AC

## 2022-01-15 NOTE — Discharge Instructions (Addendum)
Maintain adequate hydration Humidifier use will help with nasal congestion VapoRub use will help with nasal congestion and cough Take medications as prescribed Return to urgent care if symptoms worsen.

## 2022-01-15 NOTE — ED Triage Notes (Signed)
Pt c/o cough, subjective fever, nasal congestion, sinus pain/pressure. Started yesterday.

## 2022-01-15 NOTE — ED Provider Notes (Signed)
[facility]    CSN: [id number] Arrival date & time: 01/15/22  1550      History   Chief Complaint Chief Complaint  Patient presents with   Cough    HPI Jordan Franklin is a 31 y.o. female comes to the urgent care with 1 day history of nasal congestion, generalized body aches, subjective fever and chills as well as nonproductive cough.  Symptoms started yesterday and has been persistent.  No nausea, vomiting or diarrhea.  No sick contacts.  No dizziness, near syncope or syncopal episodes.  Appetite is preserved.  No shortness of breath or wheezing or chest tightness.   HPI  Past Medical History:  Diagnosis Date   Back pain     Patient Active Problem List   Diagnosis Date Noted   DDD (degenerative disc disease), lumbosacral 10/22/2021   Sciatica of left side 10/22/2021    Past Surgical History:  Procedure Laterality Date   CESAREAN SECTION      OB History   No obstetric history on file.      Home Medications    Prior to Admission medications   Medication Sig Start Date End Date Taking? Authorizing Provider  benzonatate (TESSALON) 100 MG capsule Take 1 capsule (100 mg total) by mouth 3 (three) times daily as needed for cough. 01/15/22  Yes Symphany Fleissner, Britta Mccreedy, MD  fluticasone (FLONASE) 50 MCG/ACT nasal spray Place 1 spray into both nostrils daily. 01/15/22  Yes Rohin Krejci, Britta Mccreedy, MD  guaiFENesin (MUCINEX) 600 MG 12 hr tablet Take 1 tablet (600 mg total) by mouth 2 (two) times daily for 10 days. 01/15/22 01/25/22 Yes Unique Searfoss, Britta Mccreedy, MD    Family History No family history on file.  Social History Social History   Tobacco Use   Smoking status: Never   Smokeless tobacco: Never  Vaping Use   Vaping Use: Never used  Substance Use Topics   Alcohol use: Yes   Drug use: Never     Allergies   Patient has no known allergies.   Review of Systems Review of Systems  HENT:  Positive for congestion, ear pain, sinus pressure and sinus pain. Negative for ear  discharge and sore throat.   Respiratory:  Positive for cough. Negative for chest tightness and shortness of breath.   Cardiovascular: Negative.   Gastrointestinal: Negative.   Musculoskeletal:  Positive for joint swelling.  Skin: Negative.      Physical Exam Triage Vital Signs ED Triage Vitals  Enc Vitals Group     BP      Pulse      Resp      Temp      Temp src      SpO2      Weight      Height      Head Circumference      Peak Flow      Pain Score      Pain Loc      Pain Edu?      Excl. in GC?    No data found.  Updated Vital Signs BP 104/74 (BP Location: Left Arm)   Pulse 97   Temp 98.4 F (36.9 C) (Oral)   Resp 18   Ht 5\' 9"  (1.753 m)   Wt 104.3 kg   LMP 01/02/2022 (Approximate)   SpO2 100%   BMI 33.96 kg/m   Visual Acuity Right Eye Distance:   Left Eye Distance:   Bilateral Distance:    Right Eye Near:  Left Eye Near:    Bilateral Near:     Physical Exam Vitals and nursing note reviewed.  Constitutional:      Appearance: She is ill-appearing.  HENT:     Right Ear: Tympanic membrane normal.     Left Ear: Tympanic membrane normal.     Mouth/Throat:     Pharynx: No posterior oropharyngeal erythema.  Cardiovascular:     Rate and Rhythm: Normal rate and regular rhythm.     Pulses: Normal pulses.     Heart sounds: Normal heart sounds.  Pulmonary:     Effort: Pulmonary effort is normal.     Breath sounds: Normal breath sounds.  Abdominal:     General: Bowel sounds are normal.     Palpations: Abdomen is soft.  Musculoskeletal:        General: Normal range of motion.  Neurological:     Mental Status: She is alert.      UC Treatments / Results  Labs (all labs ordered are listed, but only abnormal results are displayed) Labs Reviewed  RESP PANEL BY RT-PCR (FLU A&B, COVID) [id number]    EKG   Radiology No results found.  Procedures Procedures (including critical care time)  Medications Ordered in UC Medications - No data to  display  Initial Impression / Assessment and Plan / UC Course  I have reviewed the triage vital signs and the nursing notes.  Pertinent labs & imaging results that were available during my care of the patient were reviewed by me and considered in my medical decision making (see chart for details).     1.  Acute viral sinusitis: Flonase Maintain adequate hydration VapoRub and humidifier use will help with nasal congestion and coughing Tessalon Perles as needed for cough Return to urgent care if symptoms worsen. Final Clinical Impressions(s) / UC Diagnoses   Final diagnoses:  Acute viral sinusitis     Discharge Instructions      Maintain adequate hydration Humidifier use will help with nasal congestion VapoRub use will help with nasal congestion and cough Take medications as prescribed Return to urgent care if symptoms worsen.     ED Prescriptions     Medication Sig Dispense Auth. Provider   guaiFENesin (MUCINEX) 600 MG 12 hr tablet Take 1 tablet (600 mg total) by mouth 2 (two) times daily for 10 days. 20 tablet Damaso Laday, Britta Mccreedy, MD   fluticasone (FLONASE) 50 MCG/ACT nasal spray Place 1 spray into both nostrils daily. 16 g Macon Sandiford, Britta Mccreedy, MD   benzonatate (TESSALON) 100 MG capsule Take 1 capsule (100 mg total) by mouth 3 (three) times daily as needed for cough. 21 capsule Zaela Graley, Britta Mccreedy, MD      PDMP not reviewed this encounter.   Merrilee Jansky, MD 01/15/22 [phone]

## 2022-01-20 ENCOUNTER — Ambulatory Visit: Admission: EM | Admit: 2022-01-20 | Discharge: 2022-01-20 | Disposition: A | Discharge status: Home / Self Care

## 2022-01-20 ENCOUNTER — Other Ambulatory Visit: Payer: Self-pay

## 2022-01-20 ENCOUNTER — Encounter: Payer: Self-pay | Admitting: Emergency Medicine

## 2022-01-20 DIAGNOSIS — U071 COVID-19: Secondary | ICD-10-CM | POA: Diagnosis not present

## 2022-01-20 NOTE — ED Provider Notes (Signed)
[facility]    CSN: [id number] Arrival date & time: 01/20/22  0954      History   Chief Complaint Chief Complaint  Patient presents with   Letter for School/Work    HPI Jordan Franklin is a 31 y.o. female.   HPI  31 year old female here requesting clearance to return to work.  Patient reports that she developed symptoms of an upper EXTR infection on 01/14/2022 and was diagnosed on 01/15/2022 with COVID-19.  She has completed 5 days of quarantine as she has been vaccinated.  She reports that her only remaining symptom is nasal congestion.  She denies any runny nose, cough, or fever.  Past Medical History:  Diagnosis Date   Back pain     Patient Active Problem List   Diagnosis Date Noted   DDD (degenerative disc disease), lumbosacral 10/22/2021   Sciatica of left side 10/22/2021    Past Surgical History:  Procedure Laterality Date   CESAREAN SECTION      OB History   No obstetric history on file.      Home Medications    Prior to Admission medications   Medication Sig Start Date End Date Taking? Authorizing Provider  benzonatate (TESSALON) 100 MG capsule Take 1 capsule (100 mg total) by mouth 3 (three) times daily as needed for cough. 01/15/22   Merrilee Jansky, MD  fluticasone (FLONASE) 50 MCG/ACT nasal spray Place 1 spray into both nostrils daily. 01/15/22   Lamptey, Britta Mccreedy, MD  guaiFENesin (MUCINEX) 600 MG 12 hr tablet Take 1 tablet (600 mg total) by mouth 2 (two) times daily for 10 days. 01/15/22 01/25/22  Lamptey, Britta Mccreedy, MD    Family History Family History  Problem Relation Age of Onset   Healthy Mother     Social History Social History   Tobacco Use   Smoking status: Never   Smokeless tobacco: Never  Vaping Use   Vaping Use: Never used  Substance Use Topics   Alcohol use: Yes   Drug use: Never     Allergies   Patient has no known allergies.   Review of Systems Review of Systems  Constitutional:  Negative for fever.  HENT:   Positive for congestion. Negative for rhinorrhea.   Respiratory:  Negative for cough.      Physical Exam Triage Vital Signs ED Triage Vitals  Enc Vitals Group     BP 01/20/22 1038 104/78     Pulse Rate 01/20/22 1038 76     Resp 01/20/22 1038 20     Temp 01/20/22 1038 98.2 F (36.8 C)     Temp Source 01/20/22 1038 Oral     SpO2 01/20/22 1038 99 %     Weight --      Height --      Head Circumference --      Peak Flow --      Pain Score 01/20/22 1035 0     Pain Loc --      Pain Edu? --      Excl. in GC? --    No data found.  Updated Vital Signs BP 104/78 (BP Location: Left Arm) Comment (BP Location): large cuff  Pulse 76   Temp 98.2 F (36.8 C) (Oral)   Resp 20   LMP 01/02/2022 (Approximate)   SpO2 99%   Visual Acuity Right Eye Distance:   Left Eye Distance:   Bilateral Distance:    Right Eye Near:   Left Eye Near:  Bilateral Near:     Physical Exam Vitals and nursing note reviewed.  Constitutional:      Appearance: Normal appearance. She is not ill-appearing.  HENT:     Head: Normocephalic and atraumatic.     Right Ear: Tympanic membrane, ear canal and external ear normal. There is no impacted cerumen.     Left Ear: Tympanic membrane, ear canal and external ear normal. There is no impacted cerumen.     Nose: Congestion and rhinorrhea present.     Mouth/Throat:     Mouth: Mucous membranes are moist.     Pharynx: Oropharynx is clear. No posterior oropharyngeal erythema.  Cardiovascular:     Rate and Rhythm: Normal rate and regular rhythm.     Pulses: Normal pulses.     Heart sounds: Normal heart sounds. No murmur heard.    No friction rub. No gallop.  Pulmonary:     Effort: Pulmonary effort is normal.     Breath sounds: Normal breath sounds. No wheezing, rhonchi or rales.  Musculoskeletal:     Cervical back: Normal range of motion and neck supple.  Lymphadenopathy:     Cervical: No cervical adenopathy.  Skin:    General: Skin is warm and dry.      Capillary Refill: Capillary refill takes less than 2 seconds.     Findings: No erythema or rash.  Neurological:     General: No focal deficit present.     Mental Status: She is alert and oriented to person, place, and time.  Psychiatric:        Mood and Affect: Mood normal.        Behavior: Behavior normal.        Thought Content: Thought content normal.        Judgment: Judgment normal.      UC Treatments / Results  Labs (all labs ordered are listed, but only abnormal results are displayed) Labs Reviewed - No data to display  EKG   Radiology No results found.  Procedures Procedures (including critical care time)  Medications Ordered in UC Medications - No data to display  Initial Impression / Assessment and Plan / UC Course  I have reviewed the triage vital signs and the nursing notes.  Pertinent labs & imaging results that were available during my care of the patient were reviewed by me and considered in my medical decision making (see chart for details).  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  She reports that her only remaining symptom is nasal congestion.  She has not had a fever, cough, or runny nose.  She has been vaccinated.  Her physical exam reveals pearly-gray tympanic membranes bilaterally with normal light reflex and clear external auditory canals.  Nasal mucosa is erythematous and edematous without any significant nasal discharge in either nare.  Oropharyngeal exam is benign.  No cervical lymphadenopathy appreciable exam.  Cardiopulmonary exam shows S1-S2 heart sounds with regular rate and rhythm and lung sounds are clear to auscultation all fields.  Patient has been cleared to return to work and have given her work note to that effect.  I have advised her that she needs to wear a mask around other people for additional 5  days.   Final Clinical Impressions(s) / UC Diagnoses   Final diagnoses:  COVID-19     Discharge Instructions      Per CDC  guidelines you have completed 5 days of quarantine following a COVID-19 diagnosis. You are no longer infectious and may return to work.  Please wear a mask around other people for an additional 5 days.     ED Prescriptions   None    PDMP not reviewed this encounter.   Becky Augusta, NP 01/20/22 1051

## 2022-01-20 NOTE — ED Triage Notes (Signed)
01/14/2022 onset of symptoms 01/15/2022 [facility] at [facility] Today has congestion, denies fever, denies cough.  Patient needing a note to return to work

## 2022-01-20 NOTE — Discharge Instructions (Signed)
Per CDC guidelines you have completed 5 days of quarantine following a COVID-19 diagnosis. You are no longer infectious and may return to work.  Please wear a mask around other people for an additional 5 days.

## 2023-01-08 IMAGING — CR DG LUMBAR SPINE COMPLETE 4+V
5 series · 5 of 5 positions shown · non-contrast
Comparison: None.

CLINICAL DATA: Back pain

EXAM:
LUMBAR SPINE - COMPLETE 4+ VIEW

[l-spine ap]
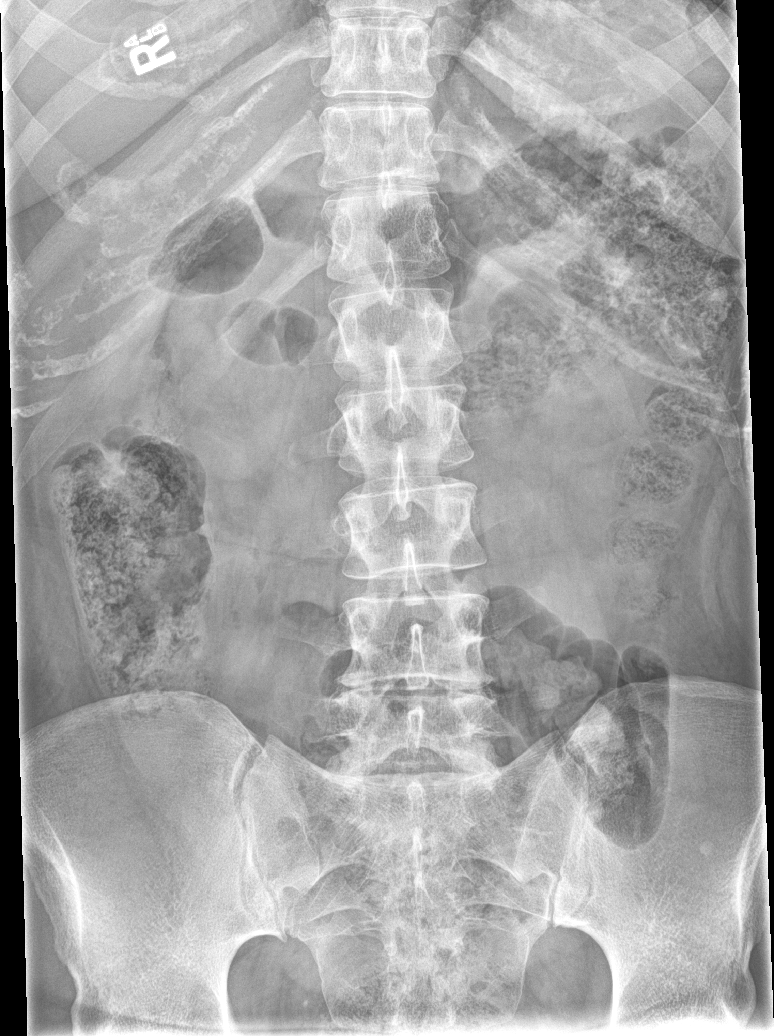

[l-spine obl (1 of 2)]
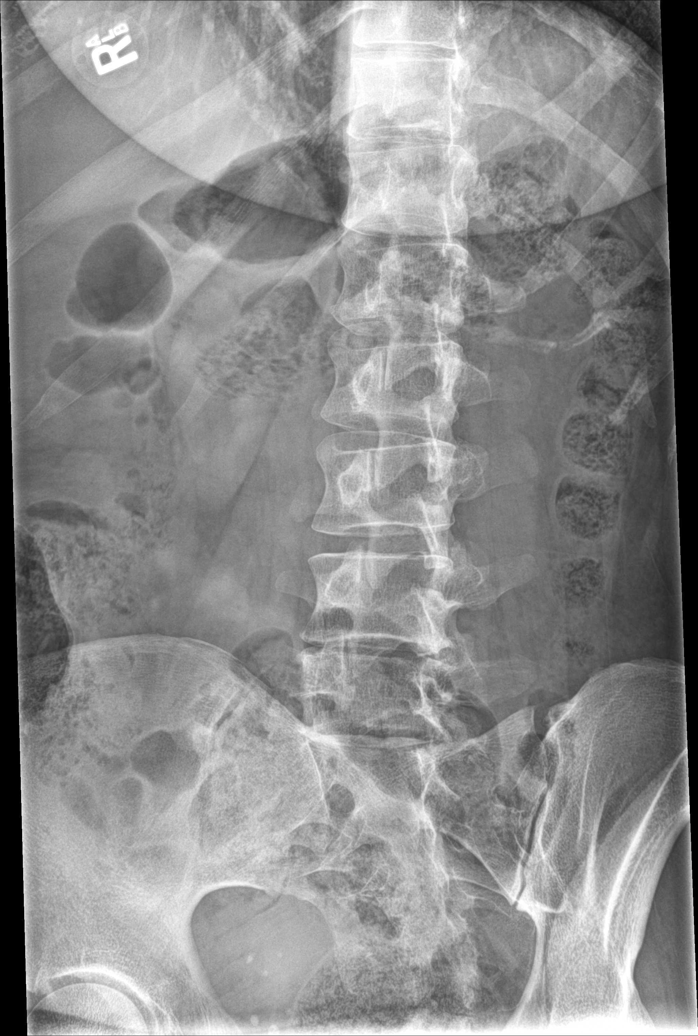

[l-spine obl (2 of 2)]
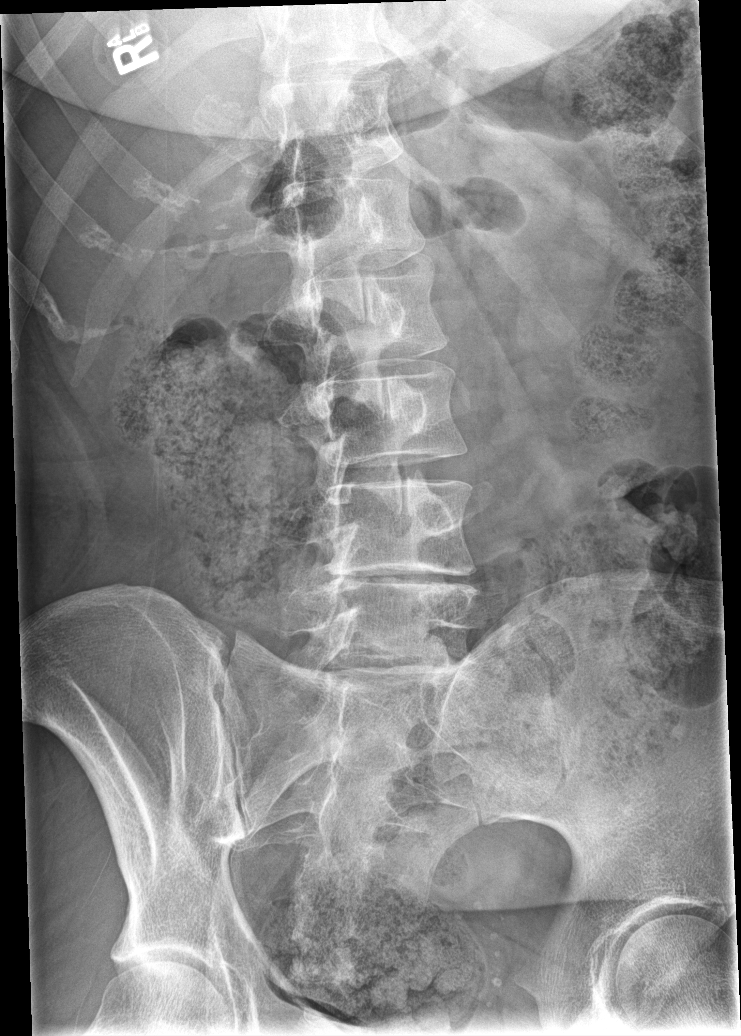

[l-spine lat]
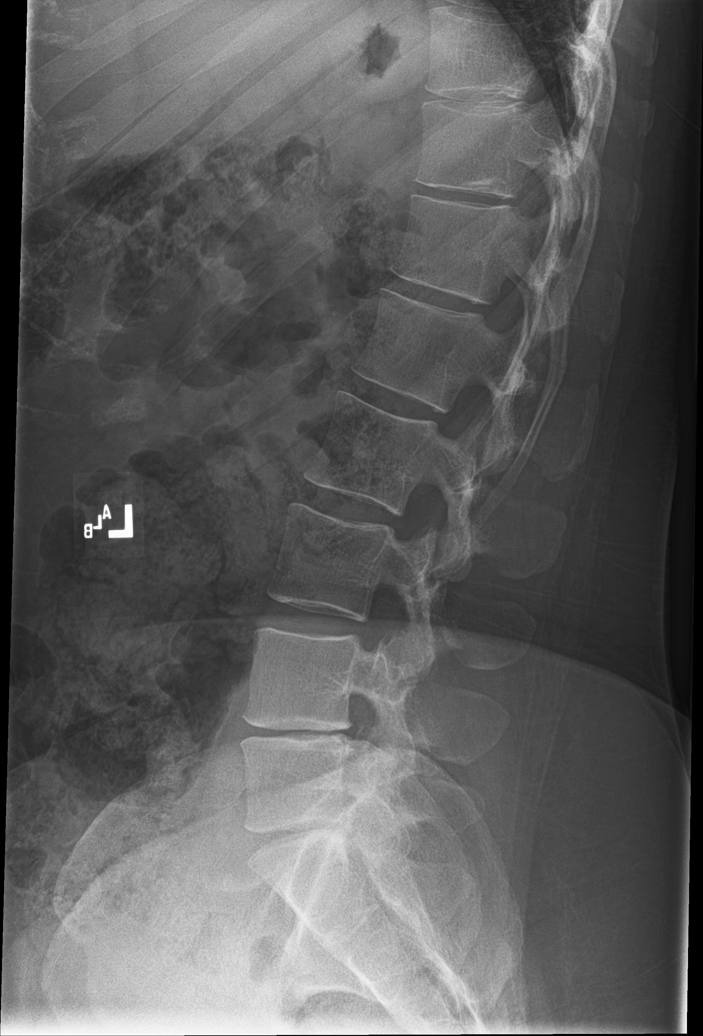

[l-spine spot]
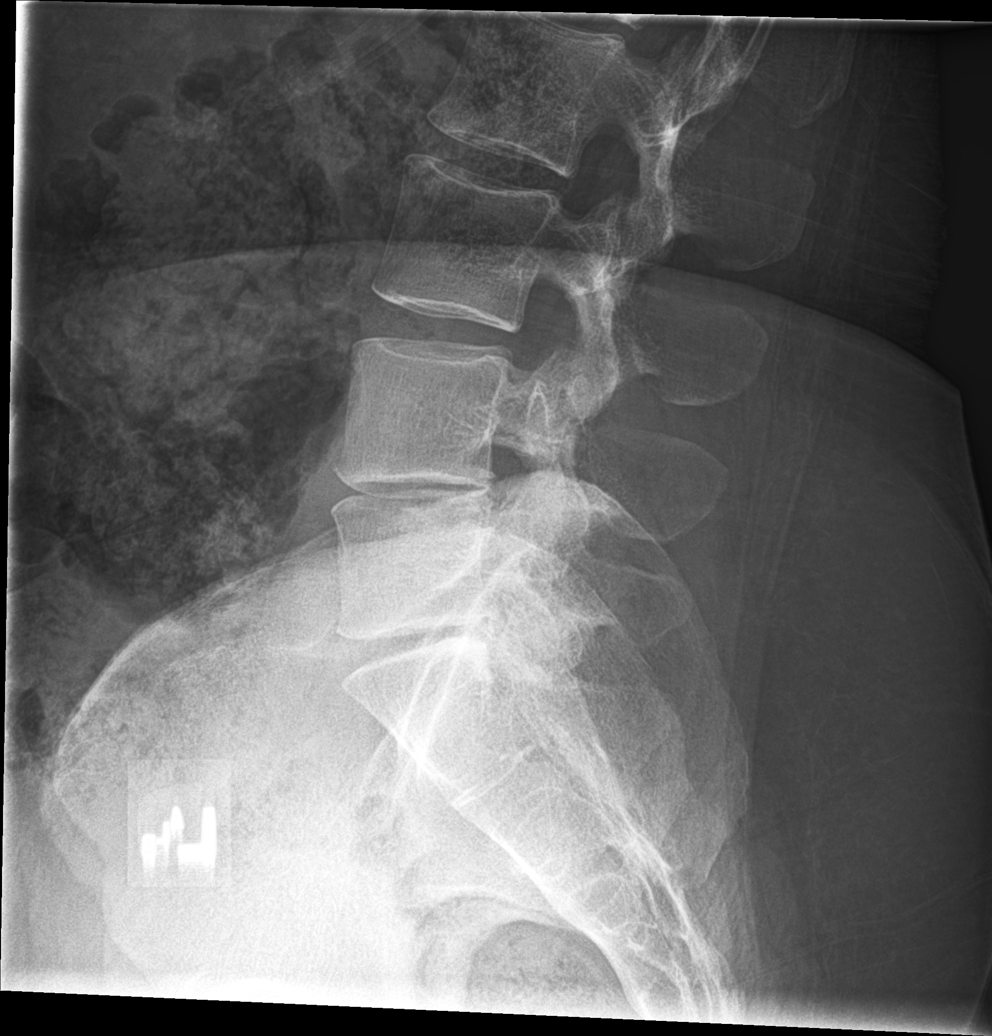

[5 of 5 positions shown; findings below may reference images not displayed]

FINDINGS: Five lumbar type vertebral bodies. No evidence of pars break.
Anteroposterior alignment is maintained. Vertebral body heights are
preserved. Disc space narrowing is present at L4-L5 and L5-S1 with
posterior endplate osteophytes. No significant facet hypertrophy.
IMPRESSION: Lower lumbar degenerative disc disease.
